# Patient Record
Sex: Male | Born: 1975 | Race: White | Hispanic: No | Marital: Married | Smoking: Former smoker
Health system: Southern US, Community
[De-identification: ages and names within clinical notes are randomized; demographics above are authoritative.]

## PROBLEM LIST (undated history)

## (undated) DIAGNOSIS — E0500 Thyrotoxicosis with diffuse goiter without thyrotoxic crisis or storm: Secondary | ICD-10-CM

## (undated) DIAGNOSIS — E7800 Pure hypercholesterolemia, unspecified: Secondary | ICD-10-CM

## (undated) DIAGNOSIS — J45909 Unspecified asthma, uncomplicated: Secondary | ICD-10-CM

## (undated) HISTORY — PX: NO PAST SURGERIES: SHX2092

---

## 2015-09-12 ENCOUNTER — Encounter: Payer: Self-pay | Admitting: *Deleted

## 2015-09-17 NOTE — Discharge Instructions (Signed)
[facility] °[facility] °ENDOSCOPIC SINUS SURGERY °[facility] ° °What is Functional Endoscopic Sinus Surgery? ° The Surgery involves making the natural openings of the sinuses larger by removing the bony partitions that separate the sinuses from the nasal cavity.  The natural sinus lining is preserved as much as possible to allow the sinuses to resume normal function after the surgery.  In some patients nasal polyps (excessively swollen lining of the sinuses) may be removed to relieve obstruction of the sinus openings.  The surgery is performed through the nose using lighted scopes, which eliminates the need for incisions on the face.  A septoplasty is a different procedure which is sometimes performed with sinus surgery.  It involves straightening the boy partition that separates the two sides of your nose.  A crooked or deviated septum may need repair if is obstructing the sinuses or nasal airflow.  Turbinate reduction is also often performed during sinus surgery.  The turbinates are bony proturberances from the side walls of the nose which swell and can obstruct the nose in patients with sinus and allergy problems.  Their size can be surgically reduced to help relieve nasal obstruction. ° °What Can Sinus Surgery Do For Me? ° Sinus surgery can reduce the frequency of sinus infections requiring antibiotic treatment.  This can provide improvement in nasal congestion, post-nasal drainage, facial pressure and nasal obstruction.  Surgery will NOT prevent you from ever having an infection again, so it usually only for patients who get infections 4 or more times yearly requiring antibiotics, or for infections that do not clear with antibiotics.  It will not cure nasal allergies, so patients with allergies may still require medication to treat their allergies after surgery. Surgery may improve headaches related to sinusitis, however, some people will continue to  require medication to control sinus headaches related to allergies.  Surgery will do nothing for other forms of headache (migraine, tension or cluster). ° °What Are the Risks of Endoscopic Sinus Surgery? ° Current techniques allow surgery to be performed safely with little risk, however, there are rare complications that patients should be aware of.  Because the sinuses are located around the eyes, there is risk of eye injury, including blindness, though again, this would be quite rare. This is usually a result of bleeding behind the eye during surgery, which puts the vision oat risk, though there are treatments to protect the vision and prevent permanent disrupted by surgery causing a leak of the spinal fluid that surrounds the brain.  More serious complications would include bleeding inside the brain cavity or damage to the brain.  Again, all of these complications are uncommon, and spinal fluid leaks can be safely managed surgically if they occur.  The most common complication of sinus surgery is bleeding from the nose, which may require packing or cauterization of the nose.  Continued sinus have polyps may experience recurrence of the polyps requiring revision surgery.  Alterations of sense of smell or injury to the tear ducts are also rare complications.  ° °What is the Surgery Like, and what is the Recovery? ° The Surgery usually takes a couple of hours to perform, and is usually performed under a general anesthetic (completely asleep).  Patients are usually discharged home after a couple of hours.  Sometimes during surgery it is necessary to pack the nose to control bleeding, and the packing is left in place for 24 - 48 hours, and removed by your surgeon.    If a septoplasty was performed during the procedure, there is often a splint placed which must be removed after 5-7 days.   °Discomfort: Pain is usually mild to moderate, and can be controlled by prescription pain medication or acetaminophen (Tylenol).   Aspirin, Ibuprofen (Advil, Motrin), or Naprosyn (Aleve) should be avoided, as they can cause increased bleeding.  Most patients feel sinus pressure like they have a bad head cold for several days.  Sleeping with your head elevated can help reduce swelling and facial pressure, as can ice packs over the face.  A humidifier may be helpful to keep the mucous and blood from drying in the nose.  ° °Diet: There are no specific diet restrictions, however, you should generally start with clear liquids and a light diet of bland foods because the anesthetic can cause some nausea.  Advance your diet depending on how your stomach feels.  Taking your pain medication with food will often help reduce stomach upset which pain medications can cause. ° °Nasal Saline Irrigation: It is important to remove blood clots and dried mucous from the nose as it is healing.  This is done by having you irrigate the nose at least 3 - 4 times daily with a salt water solution.  We recommend using NeilMed Sinus Rinse (available at the drug store).  Fill the squeeze bottle with the solution, bend over a sink, and insert the tip of the squeeze bottle into the nose ½ of an inch.  Point the tip of the squeeze bottle towards the inside corner of the eye on the same side your irrigating.  Squeeze the bottle and gently irrigate the nose.  If you bend forward as you do this, most of the fluid will flow back out of the nose, instead of down your throat.   The solution should be warm, near body temperature, when you irrigate.   Each time you irrigate, you should use a full squeeze bottle.  ° °Note that if you are instructed to use Nasal Steroid Sprays at any time after your surgery, irrigate with saline BEFORE using the steroid spray, so you do not wash it all out of the nose. °Another product, Nasal Saline Gel (such as AYR Nasal Saline Gel) can be applied in each nostril 3 - 4 times daily to moisture the nose and reduce scabbing or crusting. ° °Bleeding:   Bloody drainage from the nose can be expected for several days, and patients are instructed to irrigate their nose frequently with salt water to help remove mucous and blood clots.  The drainage may be dark red or brown, though some fresh blood may be seen intermittently, especially after irrigation.  Do not blow you nose, as bleeding may occur. If you must sneeze, keep your mouth open to allow air to escape through your mouth. ° °If heavy bleeding occurs: Irrigate the nose with saline to rinse out clots, then spray the nose 3 - 4 times with Afrin Nasal Decongestant Spray.  The spray will constrict the blood vessels to slow bleeding.  Pinch the lower half of your nose shut to apply pressure, and lay down with your head elevated.  Ice packs over the nose may help as well. If bleeding persists despite these measures, you should notify your doctor.  Do not use the Afrin routinely to control nasal congestion after surgery, as it can result in worsening congestion and may affect healing.  ° ° ° °Activity: Return to work varies among patients. Most patients will be   out of work at least 5 - 7 days to recover.  Patient may return to work after they are off of narcotic pain medication, and feeling well enough to perform the functions of their job.  Patients must avoid heavy lifting (over 10 pounds) or strenuous physical for 2 weeks after surgery, so your employer may need to assign you to light duty, or keep you out of work longer if light duty is not possible.  NOTE: you should not drive, operate dangerous machinery, do any mentally demanding tasks or make any important legal or financial decisions while on narcotic pain medication and recovering from the general anesthetic.  °  °Call Your Doctor Immediately if You Have Any of the Following: °1. Bleeding that you cannot control with the above measures °2. Loss of vision, double vision, bulging of the eye or black eyes. °3. Fever over 101 degrees °4. Neck stiffness with  severe headache, fever, nausea and change in mental state. °You are always encourage to call anytime with concerns, however, please call with requests for pain medication refills during office hours. ° °Office Endoscopy: During follow-up visits your doctor will remove any packing or splints that may have been placed and evaluate and clean your sinuses endoscopically.  Topical anesthetic will be used to make this as comfortable as possible, though you may want to take your pain medication prior to the visit.  How often this will need to be done varies from patient to patient.  After complete recovery from the surgery, you may need follow-up endoscopy from time to time, particularly if there is concern of recurrent infection or nasal polyps. ° °General Anesthesia, Adult, Care After °Refer to this sheet in the next few weeks. These instructions provide you with information on caring for yourself after your procedure. Your health care provider may also give you more specific instructions. Your treatment has been planned according to current medical practices, but problems sometimes occur. Call your health care provider if you have any problems or questions after your procedure. °WHAT TO EXPECT AFTER THE PROCEDURE °After the procedure, it is typical to experience: °· Sleepiness. °· Nausea and vomiting. °HOME CARE INSTRUCTIONS °· For the first 24 hours after general anesthesia: °¨ Have a responsible person with you. °¨ Do not drive a car. If you are alone, do not take public transportation. °¨ Do not drink alcohol. °¨ Do not take medicine that has not been prescribed by your health care provider. °¨ Do not sign important papers or make important decisions. °¨ You may resume a normal diet and activities as directed by your health care provider. °· Change bandages (dressings) as directed. °· If you have questions or problems that seem related to general anesthesia, call the hospital and ask for the anesthetist or  anesthesiologist on call. °SEEK MEDICAL CARE IF: °· You have nausea and vomiting that continue the day after anesthesia. °· You develop a rash. °SEEK IMMEDIATE MEDICAL CARE IF:  °· You have difficulty breathing. °· You have chest pain. °· You have any allergic problems. °  °This information is not intended to replace advice given to you by your health care provider. Make sure you discuss any questions you have with your health care provider. °  °Document Released: 08/30/2000 Document Revised: 06/14/2014 Document Reviewed: 09/22/2011 °Elsevier Interactive Patient Education ©2016 [employer] ° °

## 2015-09-18 ENCOUNTER — Ambulatory Visit: Payer: 59 | Admitting: Anesthesiology

## 2015-09-18 ENCOUNTER — Encounter
Admission: RE | Disposition: A | Discharge status: Home / Self Care | Payer: Self-pay | Source: Ambulatory Visit | Attending: Otolaryngology

## 2015-09-18 ENCOUNTER — Ambulatory Visit
Admission: RE | Admit: 2015-09-18 | Discharge: 2015-09-18 | Disposition: A | Discharge status: Home / Self Care | Payer: 59 | Source: Ambulatory Visit | Attending: Otolaryngology | Admitting: Otolaryngology

## 2015-09-18 DIAGNOSIS — J343 Hypertrophy of nasal turbinates: Secondary | ICD-10-CM | POA: Insufficient documentation

## 2015-09-18 DIAGNOSIS — M950 Acquired deformity of nose: Secondary | ICD-10-CM | POA: Insufficient documentation

## 2015-09-18 DIAGNOSIS — J45909 Unspecified asthma, uncomplicated: Secondary | ICD-10-CM | POA: Diagnosis not present

## 2015-09-18 DIAGNOSIS — Z87891 Personal history of nicotine dependence: Secondary | ICD-10-CM | POA: Diagnosis not present

## 2015-09-18 DIAGNOSIS — J3489 Other specified disorders of nose and nasal sinuses: Secondary | ICD-10-CM | POA: Diagnosis not present

## 2015-09-18 DIAGNOSIS — J342 Deviated nasal septum: Secondary | ICD-10-CM | POA: Diagnosis present

## 2015-09-18 HISTORY — PX: SEPTOPLASTY: SHX2393

## 2015-09-18 HISTORY — DX: Thyrotoxicosis with diffuse goiter without thyrotoxic crisis or storm: E05.00

## 2015-09-18 HISTORY — DX: Pure hypercholesterolemia, unspecified: E78.00

## 2015-09-18 HISTORY — PX: TURBINATE REDUCTION: SHX6157

## 2015-09-18 HISTORY — DX: Unspecified asthma, uncomplicated: J45.909

## 2015-09-18 SURGERY — SEPTOPLASTY, NOSE
Anesthesia: General | Site: Nose | Wound class: Clean Contaminated

## 2015-09-18 MED ORDER — FENTANYL CITRATE (PF) 100 MCG/2ML IJ SOLN
INTRAMUSCULAR | Status: DC | PRN
  Administered 2015-09-18: 100 ug via INTRAVENOUS

## 2015-09-18 MED ORDER — MIDAZOLAM HCL 5 MG/5ML IJ SOLN
INTRAMUSCULAR | Status: DC | PRN
  Administered 2015-09-18: 2 mg via INTRAVENOUS

## 2015-09-18 MED ORDER — OXYCODONE HCL 5 MG PO TABS: 5.0000 mg | ORAL_TABLET | Freq: Once | ORAL | Status: DC | PRN

## 2015-09-18 MED ORDER — ACETAMINOPHEN 10 MG/ML IV SOLN
1000.0000 mg | Freq: Four times a day (QID) | INTRAVENOUS | Status: DC
  Administered 2015-09-18: 1000 mg via INTRAVENOUS

## 2015-09-18 MED ORDER — LIDOCAINE HCL (CARDIAC) 20 MG/ML IV SOLN
INTRAVENOUS | Status: DC | PRN
  Administered 2015-09-18: 50 mg via INTRAVENOUS

## 2015-09-18 MED ORDER — SUCCINYLCHOLINE CHLORIDE 20 MG/ML IJ SOLN
INTRAMUSCULAR | Status: DC | PRN
  Administered 2015-09-18: 100 mg via INTRAVENOUS

## 2015-09-18 MED ORDER — LIDOCAINE-EPINEPHRINE 1 %-1:100000 IJ SOLN
INTRAMUSCULAR | Status: DC | PRN
  Administered 2015-09-18: 6.6 mL

## 2015-09-18 MED ORDER — HYDROMORPHONE HCL 1 MG/ML IJ SOLN: 0.2500 mg | INTRAMUSCULAR | Status: DC | PRN

## 2015-09-18 MED ORDER — CEFAZOLIN SODIUM 1-5 GM-% IV SOLN
1.0000 g | Freq: Once | INTRAVENOUS | Status: AC
  Administered 2015-09-18: 1 g via INTRAVENOUS

## 2015-09-18 MED ORDER — PROPOFOL 10 MG/ML IV BOLUS
INTRAVENOUS | Status: DC | PRN
  Administered 2015-09-18: 150 mg via INTRAVENOUS
  Administered 2015-09-18: 50 mg via INTRAVENOUS

## 2015-09-18 MED ORDER — OXYCODONE HCL 5 MG/5ML PO SOLN: 5.0000 mg | Freq: Once | ORAL | Status: DC | PRN

## 2015-09-18 MED ORDER — OXYMETAZOLINE HCL 0.05 % NA SOLN
2.0000 | Freq: Once | NASAL | Status: AC
  Administered 2015-09-18: 2 via NASAL

## 2015-09-18 MED ORDER — ONDANSETRON HCL 4 MG/2ML IJ SOLN
INTRAMUSCULAR | Status: DC | PRN
  Administered 2015-09-18: 4 mg via INTRAVENOUS

## 2015-09-18 MED ORDER — GLYCOPYRROLATE 0.2 MG/ML IJ SOLN
INTRAMUSCULAR | Status: DC | PRN
  Administered 2015-09-18: 0.2 mg via INTRAVENOUS

## 2015-09-18 MED ORDER — PHENYLEPHRINE HCL 0.5 % NA SOLN
NASAL | Status: DC | PRN
  Administered 2015-09-18: 11:00:00 via TOPICAL

## 2015-09-18 MED ORDER — LACTATED RINGERS IV SOLN
INTRAVENOUS | Status: DC
  Administered 2015-09-18: 10:00:00 via INTRAVENOUS

## 2015-09-18 MED ORDER — DEXAMETHASONE SODIUM PHOSPHATE 4 MG/ML IJ SOLN
INTRAMUSCULAR | Status: DC | PRN
  Administered 2015-09-18: 12 mg via INTRAVENOUS

## 2015-09-18 SURGICAL SUPPLY — 28 items
CANISTER SUCT 1200ML W/VALVE (MISCELLANEOUS) ×4 IMPLANT
CATH IV 18X1 1/4 SAFELET (CATHETERS) ×4 IMPLANT
COAGULATOR SUCT 8FR VV (MISCELLANEOUS) ×4 IMPLANT
DRAPE HEAD BAR (DRAPES) ×4 IMPLANT
GLOVE PI ULTRA LF STRL 7.5 (GLOVE) ×6 IMPLANT
GLOVE PI ULTRA NON LATEX 7.5 (GLOVE) ×6
IMPLANT NASAL LATERA ABSORABLE (Miscellaneous) ×4 IMPLANT
IV CATH 18X1 1/4 SAFELET (CATHETERS) ×2
KIT ROOM TURNOVER OR (KITS) ×4 IMPLANT
NEEDLE SPNL 25GX3.5 QUINCKE BL (NEEDLE) ×4 IMPLANT
NS IRRIG 500ML POUR BTL (IV SOLUTION) ×4 IMPLANT
PACK DRAPE NASAL/ENT (PACKS) ×4 IMPLANT
PACKING NASAL EPIS 4X2.4 XEROG (MISCELLANEOUS) ×4 IMPLANT
PAD GROUND ADULT SPLIT (MISCELLANEOUS) ×4 IMPLANT
PATTIES SURGICAL .5 X3 (DISPOSABLE) ×4 IMPLANT
SOL ANTI-FOG 6CC FOG-OUT (MISCELLANEOUS) ×2 IMPLANT
SOL FOG-OUT ANTI-FOG 6CC (MISCELLANEOUS) ×2
SPLINT NASAL SEPTAL BLV .50 ST (MISCELLANEOUS) ×4 IMPLANT
SPONGE XRAY 4X4 16PLY STRL (MISCELLANEOUS) ×4 IMPLANT
STRAP BODY AND KNEE 60X3 (MISCELLANEOUS) ×4 IMPLANT
SUT CHROMIC 3-0 (SUTURE) ×2
SUT CHROMIC 3-0 KS 27XMFL CR (SUTURE) ×2
SUT ETHILON 3-0 KS 30 BLK (SUTURE) ×4 IMPLANT
SUT PLAIN GUT 4-0 (SUTURE) ×4 IMPLANT
SUTURE CHRMC 3-0 KS 27XMFL CR (SUTURE) ×2 IMPLANT
SYR 3ML LL SCALE MARK (SYRINGE) ×4 IMPLANT
TOWEL OR 17X26 4PK STRL BLUE (TOWEL DISPOSABLE) ×4 IMPLANT
WATER STERILE IRR 250ML POUR (IV SOLUTION) ×4 IMPLANT

## 2015-09-18 NOTE — Transfer of Care (Signed)
Immediate Anesthesia Transfer of Care Note  Patient: Tim Salazar  Procedure(s) Performed: Procedure(s): SEPTOPLASTY (N/A) NASAL VALVE REPAIR WITH NASAL IMPLANT (Bilateral) OUTFRACTURE INFERIOR TURBINATE  (Bilateral)  Patient Location: PACU  Anesthesia Type: General ETT  Level of Consciousness: awake, alert  and patient cooperative  Airway and Oxygen Therapy: Patient Spontanous Breathing and Patient connected to supplemental oxygen  Post-op Assessment: Post-op Vital signs reviewed, Patient's Cardiovascular Status Stable, Respiratory Function Stable, Patent Airway and No signs of Nausea or vomiting  Post-op Vital Signs: Reviewed and stable  Complications: No apparent anesthesia complications

## 2015-09-18 NOTE — Anesthesia Postprocedure Evaluation (Signed)
Anesthesia Post Note  Patient: Tim Salazar  Procedure(s) Performed: Procedure(s) (LRB): SEPTOPLASTY (N/A) NASAL VALVE REPAIR WITH NASAL IMPLANT (Bilateral) OUTFRACTURE INFERIOR TURBINATE  (Bilateral)  Patient location during evaluation: PACU Anesthesia Type: General Level of consciousness: awake and alert Pain management: pain level controlled Vital Signs Assessment: post-procedure vital signs reviewed and stable Respiratory status: spontaneous breathing and respiratory function stable Cardiovascular status: blood pressure returned to baseline and stable Postop Assessment: no headache, no signs of nausea or vomiting and adequate PO intake Anesthetic complications: no    Kahleel Fadeley, III,  Flynn Lininger D

## 2015-09-18 NOTE — Op Note (Signed)
09/18/2015  6:41 PM    Kedzierski, Elige RadonBradley  [id number]   Pre-Op Dx:  Severely deviated septum , enlarged inferior turbinates , nasal valve collapse , airway obstrtion  Post-op Dx:  same  Proc:  Nasal vestibular stenosis repair bilaterally ,sptolasty, outfracture inferior turbnates  Surg:  Suresh Audi H  Anes:  GOT  EBL:   50 mL  Comp:   none   Findings:   extremely deviated septum in a Z-shape deformity  Procedure:   The patient was brought to the operating room and placed in a supine position on the operating table. The nose prepped using 6-1/2 mL of 1% Xylocaine with epi 1 200,000 for vasoconstriction. The nose was then packed with cottonoid pledgets soaked in phenylephrine and Xylocaine. He was prepped and draped sterile fashion.  Cotton pledgets were removed and a 0 scope was used for visualization of the nasal passages on both sides. There was a huge septal spur inferiorly on the left side blocking the entire lower half of the nasal airway. There was a deviation of the ethmoid plate to the right blocking the upper airway.  Nasal vestibular repair was done by first marking the outside of the nose. The nasal alar was marked and ears midportion for insertion of the cannula for dissection. Lateral marks were made on the nose overlying the nasal bone as well as the intercartilaginous area. This was to create a pathway for following for insertion of the implant. This was marked on both sides and symmetrical. Using a sharp double hook the is a low was stretched to provide counter tension, and then the cannula was inserted on the edge of the nasal alar rim. It was passed over the lower lateral and upper lateral cartilages and then over the nasal bone to the designated area. The polymer implant was then deployed into the tissue overlying the periosteum and the cannula was removed with the polymer implant remaining in its position. This was repeated on the opposite side as well. The implants  were noted to be at the correct depth and sitting in the correct position. There was minimal bleeding from the cannula insertion site.  A left Killian incision was created in the anterior septum and carried through the subcutaneous and perichondrium. The perichondrium was elevated on the left side of the quadrangular plate. The bony cartilaginous junction was then split and the mucoperiosteum was elevated on both sides of the ethmoid and vomer. The ethmoid was markedly deviated to the right side and the posterior vomer had a spur on the left. The maxillary crest was way off the left side. Mucoperiosteum was elevated off of this. A chisel was used to free up some of the maxillary crest and remove this. The crooked vomer was removed and some of the crooked ethmoid plate was removed as well. Part of the posterior quadrangular plate and inferior rim that was overhanging on the left side was removed. This allowed the remaining cartilage to move back towards the midline and sit in a good position. The mucosal flaps were sutured in position with a 40 plain gut suture in a through and through whip stitch fashion. The caudal edge was anchored to the inferior nasal spine with a 3-0 chromic through and through suture. The inferior turbinates were outfractured to create a little more room here. The 0 scope was passed through the nose again and there was good airflow on both sides now to the back of the nose. The right middle turbinate was thin and  displaced laterally because there was no room there. It was infractured and some xerogel was placed in the middle meatus to try to help hold this more open.  Patient tolerated the procedure well. He was awakened taken to the recovery room in satisfactory condition. There were no operative complications.  Dispo:   To PACU to be discharged home  Plan:  To follow-up in the office in 6 days. He'll rest at home. We'll start saline flushes tomorrow. Be on a prednisone taper and  antibiotics at home for the next week. Return sooner if problems  Eliazer Hemphill H  09/18/2015 6:41 PM

## 2015-09-18 NOTE — Anesthesia Preprocedure Evaluation (Signed)
Anesthesia Evaluation  Patient identified by MRN, date of birth, ID band Patient awake    Reviewed: Allergy & Precautions, H&P , NPO status , Patient's Chart, lab work & pertinent test results  Airway Mallampati: II  TM Distance: >3 FB Neck ROM: full    Dental no notable dental hx.    Pulmonary asthma , former smoker,    Pulmonary exam normal        Cardiovascular negative cardio ROS Normal cardiovascular exam     Neuro/Psych    GI/Hepatic negative GI ROS, Neg liver ROS,   Endo/Other  negative endocrine ROS  Renal/GU negative Renal ROS  negative genitourinary   Musculoskeletal   Abdominal   Peds  Hematology negative hematology ROS (+)   Anesthesia Other Findings   Reproductive/Obstetrics                             Anesthesia Physical Anesthesia Plan  ASA: II  Anesthesia Plan: General ETT   Post-op Pain Management:    Induction:   Airway Management Planned:   Additional Equipment:   Intra-op Plan:   Post-operative Plan:   Informed Consent: I have reviewed the patients History and Physical, chart, labs and discussed the procedure including the risks, benefits and alternatives for the proposed anesthesia with the patient or authorized representative who has indicated his/her understanding and acceptance.     Plan Discussed with: CRNA  Anesthesia Plan Comments:         Anesthesia Quick Evaluation

## 2015-09-18 NOTE — H&P (Signed)
  H&P has been reviewed and no changes necessary. To be downloaded later. 

## 2015-09-18 NOTE — Anesthesia Procedure Notes (Signed)
Procedure Name: Intubation Date/Time: 09/18/2015 11:23 AM Performed by: Andee PolesBUSH, Todd Argabright Pre-anesthesia Checklist: Patient identified, Emergency Drugs available, Suction available, Patient being monitored and Timeout performed Patient Re-evaluated:Patient Re-evaluated prior to inductionOxygen Delivery Method: Circle system utilized Preoxygenation: Pre-oxygenation with 100% oxygen Intubation Type: IV induction Ventilation: Mask ventilation without difficulty Laryngoscope Size: Mac and 3 Grade View: Grade III Tube type: Oral Rae Tube size: 7.5 mm Number of attempts: 2 Airway Equipment and Method: Rigid stylet and Video-laryngoscopy Placement Confirmation: ETT inserted through vocal cords under direct vision,  positive ETCO2 and breath sounds checked- equal and bilateral Tube secured with: Tape Dental Injury: Teeth and Oropharynx as per pre-operative assessment  Difficulty Due To: Difficult Airway- due to limited oral opening

## 2015-10-15 ENCOUNTER — Other Ambulatory Visit: Payer: Self-pay | Admitting: Internal Medicine

## 2015-10-15 DIAGNOSIS — E0500 Thyrotoxicosis with diffuse goiter without thyrotoxic crisis or storm: Secondary | ICD-10-CM

## 2015-12-02 ENCOUNTER — Encounter
Admission: RE | Admit: 2015-12-02 | Discharge: 2015-12-02 | Disposition: A | Discharge status: Home / Self Care | Payer: 59 | Source: Ambulatory Visit | Attending: Internal Medicine | Admitting: Internal Medicine

## 2015-12-02 ENCOUNTER — Ambulatory Visit
Admission: RE | Admit: 2015-12-02 | Discharge: 2015-12-02 | Disposition: A | Discharge status: Home / Self Care | Payer: 59 | Source: Ambulatory Visit | Attending: Internal Medicine | Admitting: Internal Medicine

## 2015-12-02 DIAGNOSIS — E0500 Thyrotoxicosis with diffuse goiter without thyrotoxic crisis or storm: Secondary | ICD-10-CM

## 2015-12-02 MED ORDER — SODIUM IODIDE I-123 3.7 MBQ PO CAPS
152.8000 | ORAL_CAPSULE | Freq: Once | ORAL | Status: AC
Start: 2015-12-02 — End: 2015-12-02
  Administered 2015-12-02: 152.8 via ORAL

## 2015-12-03 ENCOUNTER — Encounter
Admission: RE | Admit: 2015-12-03 | Discharge: 2015-12-03 | Disposition: A | Discharge status: Home / Self Care | Payer: 59 | Source: Ambulatory Visit | Attending: Internal Medicine | Admitting: Internal Medicine

## 2015-12-03 DIAGNOSIS — E0500 Thyrotoxicosis with diffuse goiter without thyrotoxic crisis or storm: Secondary | ICD-10-CM | POA: Diagnosis not present

## 2015-12-05 ENCOUNTER — Encounter
Admission: RE | Admit: 2015-12-05 | Discharge: 2015-12-05 | Disposition: A | Discharge status: Home / Self Care | Payer: 59 | Source: Ambulatory Visit | Attending: Internal Medicine | Admitting: Internal Medicine

## 2015-12-05 DIAGNOSIS — E0500 Thyrotoxicosis with diffuse goiter without thyrotoxic crisis or storm: Secondary | ICD-10-CM

## 2015-12-05 MED ORDER — SODIUM IODIDE I 131 CAPSULE
15.2900 | Freq: Once | INTRAVENOUS | Status: AC | PRN
  Administered 2015-12-05: 15.29 via ORAL
